# Patient Record
Sex: Female | Born: 2000 | Race: White | Hispanic: No | Marital: Single | Smoking: Never smoker
Health system: Southern US, Community
[De-identification: ages and names within clinical notes are randomized; demographics above are authoritative.]

## PROBLEM LIST (undated history)

## (undated) DIAGNOSIS — F909 Attention-deficit hyperactivity disorder, unspecified type: Secondary | ICD-10-CM

---
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
## 2021-01-10 ENCOUNTER — Telehealth: Payer: Self-pay

## 2021-01-10 ENCOUNTER — Ambulatory Visit: Payer: 59

## 2021-01-10 NOTE — Telephone Encounter (Signed)
Please schedule OV for pt to discuss with Dr. Durene Cal.

## 2021-01-10 NOTE — Telephone Encounter (Signed)
Patient called in and wanted to see if Dr. Durene Cal could send something in for her to gain weight patient stated she is getting very insecure about her weight.

## 2021-01-10 NOTE — Telephone Encounter (Signed)
Tried calling the patient to schedule appt but the phone just kept ringing. Patient is coming in tomorrow for a nurse visit and will schedule her then.

## 2021-01-11 ENCOUNTER — Ambulatory Visit (INDEPENDENT_AMBULATORY_CARE_PROVIDER_SITE_OTHER): Payer: 59 | Admitting: *Deleted

## 2021-01-11 ENCOUNTER — Encounter: Payer: Self-pay | Admitting: *Deleted

## 2021-01-11 ENCOUNTER — Other Ambulatory Visit: Payer: Self-pay

## 2021-01-11 DIAGNOSIS — N920 Excessive and frequent menstruation with regular cycle: Secondary | ICD-10-CM

## 2021-01-11 MED ORDER — MEDROXYPROGESTERONE ACETATE 150 MG/ML IM SUSP
150.0000 mg | Freq: Once | INTRAMUSCULAR | Status: AC
Start: 2021-01-11 — End: 2021-01-11
  Administered 2021-01-11: 150 mg via INTRAMUSCULAR

## 2021-01-11 NOTE — Telephone Encounter (Signed)
Reached out to pt and phone rang and then a busy signal came on, unable to reach pt.

## 2021-01-11 NOTE — Telephone Encounter (Signed)
Patient came in to the office and was seen for her NV, when checking out she said she needed to speak with Dr.Hunter about medication. Advised he was with a patient and would need to schedule an appointment to discuss this, offered an appointment next week. Patient declined.

## 2021-01-11 NOTE — Progress Notes (Signed)
Per orders of Dr. Durene Cal, injection of MedroxyProgesterone 150 mg/ ml IM injection given Right upper outer quadrant by Corky Mull LPN.  Patient tolerated injection well. Patient will make appointment for next injection July 7th thru July 21st. Pt verbalized understanding.

## 2021-01-18 ENCOUNTER — Ambulatory Visit: Payer: 59 | Admitting: Family Medicine

## 2021-01-18 DIAGNOSIS — Z0289 Encounter for other administrative examinations: Secondary | ICD-10-CM

## 2021-01-22 ENCOUNTER — Ambulatory Visit: Payer: 59 | Admitting: Physician Assistant

## 2021-01-22 NOTE — Progress Notes (Deleted)
Isabel Cunningham is a 20 y.o. female here for a new problem.  I acted as a Neurosurgeon for [employer], PA-C [employer], LPN   History of Present Illness:   No chief complaint on file.   HPI  Past Medical History:  Diagnosis Date  . Attention deficit hyperactivity disorder (ADHD)    prior on meds- no longer using. prescribed by pediatrician.      Social History   Tobacco Use  . Smoking status: Never Smoker  . Smokeless tobacco: Never Used  Substance Use Topics  . Alcohol use: Never  . Drug use: Never    Past Surgical History:  Procedure Laterality Date  . DENTAL SURGERY  07/22/2018   braces was added 03/23/2018  . TONSILLECTOMY  2009    Family History  Problem Relation Age of Onset  . Diabetes Father        told "borderline" per he  . Asthma Maternal Grandmother   . COPD Maternal Grandmother   . Rheum arthritis Maternal Grandmother        trial medication affected immune system and eventually killed her  . Heart disease Maternal Grandfather   . Other Paternal Grandmother        biological- healthy as far as they know  . Other Paternal Grandfather        biological- healthy as far as they know  . Asthma Brother   . ADD / ADHD Brother     Allergies  Allergen Reactions  . Bee Venom Swelling and Rash    Current Medications:   Current Outpatient Medications:  .  EPINEPHrine 0.3 mg/0.3 mL IJ SOAJ injection, Inject 0.3 mLs (0.3 mg total) into the muscle as needed for anaphylaxis., Disp: 1 each, Rfl: 1 .  medroxyPROGESTERone (DEPO-PROVERA) 150 MG/ML injection, Inject 150 mg into the muscle every 3 (three) months., Disp: , Rfl:    Review of Systems:   ROS  Vitals:   There were no vitals filed for this visit.   There is no height or weight on file to calculate BMI.  Physical Exam:   Physical Exam  Results for orders placed or performed in visit on 05/26/20  Novel Coronavirus, NAA ([facility])   Specimen: Nasopharyngeal(NP) swabs in vial transport medium    Nasopharynge  Screenin  Result Value Ref Range   SARS-CoV-2, NAA Not Detected Not Detected    Assessment and Plan:   There are no diagnoses linked to this encounter.    ***  Jarold Motto, PA-C

## 2021-04-24 ENCOUNTER — Telehealth: Payer: Self-pay

## 2021-04-24 ENCOUNTER — Ambulatory Visit (INDEPENDENT_AMBULATORY_CARE_PROVIDER_SITE_OTHER): Payer: 59

## 2021-04-24 ENCOUNTER — Other Ambulatory Visit: Payer: Self-pay

## 2021-04-24 DIAGNOSIS — Z309 Encounter for contraceptive management, unspecified: Secondary | ICD-10-CM | POA: Diagnosis not present

## 2021-04-24 DIAGNOSIS — N930 Postcoital and contact bleeding: Secondary | ICD-10-CM

## 2021-04-24 MED ORDER — MEDROXYPROGESTERONE ACETATE 150 MG/ML IM SUSP
150.0000 mg | Freq: Once | INTRAMUSCULAR | Status: AC
  Administered 2021-04-24: 150 mg via INTRAMUSCULAR

## 2021-04-24 NOTE — Telephone Encounter (Signed)
Patient seen today in office for nurse visit. During appt, she states that she is experiencing bleeding during intercourse. States this is a new problem that has only ever happened with her current partner. Described as a "lite period" that lasts for 2-3 days. Advised she needed to make an appt with Dr. Durene Cal, but she declined. Advised that I would send message to PCP.

## 2021-04-24 NOTE — Progress Notes (Signed)
Isabel Cunningham 20 yr old female presents to office for 3 month birth control encounter per Tana Conch, MD. Administered medroxyprogesterone 150mg /mL left side. Patient tolerated well.

## 2021-04-24 NOTE — Telephone Encounter (Signed)
Please refer her to GYN under postcoital bleeding tell her I really want her to follow-up with them

## 2021-04-24 NOTE — Telephone Encounter (Signed)
Error

## 2021-04-25 NOTE — Telephone Encounter (Signed)
I called the number on file for pt and that is not a working number, referral to GYN has been placed.

## 2021-04-25 NOTE — Addendum Note (Signed)
Addended by: Lieutenant Diego A on: 04/25/2021 09:32 AM   Modules accepted: Orders

## 2021-06-08 ENCOUNTER — Telehealth: Payer: 59 | Admitting: Family Medicine

## 2021-06-08 DIAGNOSIS — F900 Attention-deficit hyperactivity disorder, predominantly inattentive type: Secondary | ICD-10-CM

## 2021-07-26 ENCOUNTER — Ambulatory Visit: Payer: 59

## 2021-08-02 ENCOUNTER — Other Ambulatory Visit: Payer: Self-pay

## 2021-08-02 ENCOUNTER — Ambulatory Visit (INDEPENDENT_AMBULATORY_CARE_PROVIDER_SITE_OTHER): Payer: 59

## 2021-08-02 DIAGNOSIS — Z3042 Encounter for surveillance of injectable contraceptive: Secondary | ICD-10-CM | POA: Diagnosis not present

## 2021-08-02 MED ORDER — MEDROXYPROGESTERONE ACETATE 150 MG/ML IM SUSP
150.0000 mg | Freq: Once | INTRAMUSCULAR | Status: AC
  Administered 2021-08-02: 150 mg via INTRAMUSCULAR

## 2021-08-02 NOTE — Progress Notes (Signed)
After obtaining consent, and per orders of Dr. Durene Cal, injection of Depo Provera  given by Erick Alley.

## 2021-11-01 ENCOUNTER — Other Ambulatory Visit: Payer: Self-pay

## 2021-11-01 ENCOUNTER — Ambulatory Visit (INDEPENDENT_AMBULATORY_CARE_PROVIDER_SITE_OTHER): Payer: 59

## 2021-11-01 DIAGNOSIS — Z3042 Encounter for surveillance of injectable contraceptive: Secondary | ICD-10-CM | POA: Diagnosis not present

## 2021-11-01 MED ORDER — MEDROXYPROGESTERONE ACETATE 150 MG/ML IM SUSP
150.0000 mg | Freq: Once | INTRAMUSCULAR | Status: AC
  Administered 2021-11-01: 150 mg via INTRAMUSCULAR

## 2021-11-01 NOTE — Progress Notes (Signed)
Per orders of Dr. Durene Cal, injection of Depo-Provera 150 mg given IM by Corky Mull, LPN in right upper outer Patient tolerated injection well. Patient will make appointment for next injection April 27th thru May 11.

## 2022-01-29 ENCOUNTER — Encounter: Payer: Self-pay | Admitting: *Deleted

## 2022-01-29 ENCOUNTER — Ambulatory Visit (INDEPENDENT_AMBULATORY_CARE_PROVIDER_SITE_OTHER): Payer: 59 | Admitting: *Deleted

## 2022-01-29 DIAGNOSIS — Z309 Encounter for contraceptive management, unspecified: Secondary | ICD-10-CM | POA: Diagnosis not present

## 2022-01-29 MED ORDER — MEDROXYPROGESTERONE ACETATE 150 MG/ML IM SUSP
150.0000 mg | Freq: Once | INTRAMUSCULAR | Status: AC
  Administered 2022-01-29: 150 mg via INTRAMUSCULAR

## 2022-01-29 NOTE — Progress Notes (Signed)
I have reviewed and agree with note, evaluation, plan.   Sela Falk, MD  

## 2022-01-29 NOTE — Progress Notes (Signed)
Per orders of Dr. Durene Cal, injection of Depo-Provera 150 mg/ml given IM by Corky Mull, LPN in Right Upper Outer Quadrant. ?Patient tolerated injection well. Told pt to make appointment for next injection July 25 - August 8 and given piece of paper with it written on. Pt verbalized understanding. ? ?

## 2022-04-17 ENCOUNTER — Ambulatory Visit (INDEPENDENT_AMBULATORY_CARE_PROVIDER_SITE_OTHER): Payer: 59

## 2022-04-17 DIAGNOSIS — Z309 Encounter for contraceptive management, unspecified: Secondary | ICD-10-CM | POA: Diagnosis not present

## 2022-04-17 MED ORDER — MEDROXYPROGESTERONE ACETATE 150 MG/ML IM SUSP
150.0000 mg | Freq: Once | INTRAMUSCULAR | Status: AC
  Administered 2022-04-17: 150 mg via INTRAMUSCULAR

## 2022-04-18 ENCOUNTER — Ambulatory Visit: Payer: 59

## 2022-04-26 MED ORDER — MEDROXYPROGESTERONE ACETATE 150 MG/ML IM SUSP
150.0000 mg | Freq: Once | INTRAMUSCULAR | Status: AC
  Administered 2022-04-26: 150 mg via INTRAMUSCULAR

## 2022-04-26 NOTE — Addendum Note (Signed)
Addended by: Lieutenant Diego A on: 04/26/2022 09:20 AM   Modules accepted: Orders

## 2022-04-26 NOTE — Progress Notes (Signed)
Pt tolerated depo well 

## 2022-06-07 ENCOUNTER — Encounter: Payer: Self-pay | Admitting: Family

## 2022-06-07 ENCOUNTER — Ambulatory Visit: Payer: 59 | Admitting: Family

## 2022-06-07 VITALS — BP 125/85 | HR 101 | Temp 98.7°F | Ht <= 58 in | Wt 122.8 lb

## 2022-06-07 DIAGNOSIS — J069 Acute upper respiratory infection, unspecified: Secondary | ICD-10-CM | POA: Diagnosis not present

## 2022-06-07 NOTE — Progress Notes (Signed)
Patient ID: Isabel Cunningham, female    DOB: 12-23-2000, 21 y.o.   MRN: [id number]  Chief Complaint  Patient presents with   Sinus Problem    Pt c/o headache, nasal congestion and wet cough. Present since yesterday. Has tried mucinex and tylenol head and cold which did not help. Covid negative yesterday.     HPI:      Upper Respiratory Infection: Symptoms include achiness, headache described as frontal, nasal congestion, non productive cough, post nasal drip, and nasal drainage .  Onset of symptoms was 1 day ago, gradually worsening since that time. She is drinking moderate amounts of fluids. Evaluation to date: none.  Treatment to date:  Tylenol head & cold, Mucinex .     Assessment & Plan:  1. Viral upper respiratory tract infection sx started yesterday, covid negative, no fever, mostly sinus, started coughing this am. Advised on taking 600mg  Advil tid for aches, sinus pressure, headache, and generic Sudafed or Claritin D for sinus sx per package directions for 5-6 days. Drink at least 2 L of fluids daily.   Subjective:    Outpatient Medications Prior to Visit  Medication Sig Dispense Refill   EPINEPHrine 0.3 mg/0.3 mL IJ SOAJ injection Inject 0.3 mLs (0.3 mg total) into the muscle as needed for anaphylaxis. 1 each 1   medroxyPROGESTERone (DEPO-PROVERA) 150 MG/ML injection Inject 150 mg into the muscle every 3 (three) months.     No facility-administered medications prior to visit.   Past Medical History:  Diagnosis Date   Attention deficit hyperactivity disorder (ADHD)    prior on meds- no longer using. prescribed by pediatrician.    Past Surgical History:  Procedure Laterality Date   DENTAL SURGERY  07/22/2018   braces was added 03/23/2018   TONSILLECTOMY  2009   Allergies  Allergen Reactions   Bee Venom Swelling and Rash      Objective:    Physical Exam Vitals and nursing note reviewed.  Constitutional:      Appearance: Normal appearance. She is ill-appearing.   HENT:     Right Ear: There is impacted cerumen.     Left Ear: Tympanic membrane and ear canal normal.     Nose:     Right Sinus: No frontal sinus tenderness (pressure).     Left Sinus: No frontal sinus tenderness (pressure).     Mouth/Throat:     Mouth: Mucous membranes are moist.     Pharynx: Posterior oropharyngeal erythema present. No pharyngeal swelling, oropharyngeal exudate or uvula swelling.  Cardiovascular:     Rate and Rhythm: Normal rate and regular rhythm.  Pulmonary:     Effort: Pulmonary effort is normal.     Breath sounds: Normal breath sounds.  Musculoskeletal:        General: Normal range of motion.  Lymphadenopathy:     Cervical: No cervical adenopathy.  Skin:    General: Skin is warm and dry.  Neurological:     Mental Status: She is alert.  Psychiatric:        Mood and Affect: Mood normal.        Behavior: Behavior normal.    BP 125/85 (BP Location: Left Arm, Patient Position: Sitting, Cuff Size: Large)   Pulse (!) 101   Temp 98.7 F (37.1 C) (Temporal)   Ht 4' (1.219 m)   Wt 122 lb 12.8 oz (55.7 kg)   LMP  (LMP Unknown)   SpO2 98%   BMI 37.47 kg/m  Wt Readings from Last 3  Encounters:  06/07/22 122 lb 12.8 oz (55.7 kg)  03/22/20 99 lb (44.9 kg) (4 %, Z= -1.77)*  03/10/20 99 lb 9.6 oz (45.2 kg) (4 %, Z= -1.71)*   * Growth percentiles are based on CDC (Girls, 2-20 Years) data.       Dulce Sellar, NP

## 2022-06-17 ENCOUNTER — Encounter: Payer: Self-pay | Admitting: *Deleted

## 2022-07-18 ENCOUNTER — Ambulatory Visit (INDEPENDENT_AMBULATORY_CARE_PROVIDER_SITE_OTHER): Payer: 59 | Admitting: *Deleted

## 2022-07-18 DIAGNOSIS — Z3042 Encounter for surveillance of injectable contraceptive: Secondary | ICD-10-CM

## 2022-07-18 LAB — POCT URINE PREGNANCY: Preg Test, Ur: NEGATIVE

## 2022-07-18 MED ORDER — MEDROXYPROGESTERONE ACETATE 150 MG/ML IM SUSP
150.0000 mg | Freq: Once | INTRAMUSCULAR | Status: AC
  Administered 2022-07-18: 150 mg via INTRAMUSCULAR

## 2022-07-18 NOTE — Progress Notes (Signed)
Patient present for Depo Provera Inj  UPT done with negative results  Injection given on Rt hip IM Patient tolerated well  Advise to schedule nurse visit for F/U Depo inj between Jan/07/2023 -08/18/2023 Patient verbalized understanding

## 2022-09-05 ENCOUNTER — Encounter: Payer: Self-pay | Admitting: *Deleted

## 2022-10-03 ENCOUNTER — Ambulatory Visit (INDEPENDENT_AMBULATORY_CARE_PROVIDER_SITE_OTHER): Payer: 59

## 2022-10-03 DIAGNOSIS — Z309 Encounter for contraceptive management, unspecified: Secondary | ICD-10-CM | POA: Diagnosis not present

## 2022-10-03 MED ORDER — MEDROXYPROGESTERONE ACETATE 150 MG/ML IM SUSY: PREFILLED_SYRINGE | Freq: Once | INTRAMUSCULAR | Status: AC

## 2022-10-08 NOTE — Progress Notes (Signed)
Pt received Birth Control in right upper quadrant, pt tolerated well

## 2022-10-10 ENCOUNTER — Telehealth: Payer: Self-pay | Admitting: Family Medicine

## 2022-10-10 NOTE — Telephone Encounter (Signed)
Vml for patient to call back and sch CPE with Dr Retail banker

## 2023-01-02 ENCOUNTER — Ambulatory Visit (INDEPENDENT_AMBULATORY_CARE_PROVIDER_SITE_OTHER): Payer: 59

## 2023-01-02 DIAGNOSIS — Z309 Encounter for contraceptive management, unspecified: Secondary | ICD-10-CM

## 2023-01-02 MED ORDER — MEDROXYPROGESTERONE ACETATE 150 MG/ML IM SUSY
150.0000 mg | PREFILLED_SYRINGE | Freq: Once | INTRAMUSCULAR | Status: AC
  Administered 2023-01-02: 150 mg via INTRAMUSCULAR

## 2023-01-02 NOTE — Progress Notes (Signed)
Isabel Cunningham 22 yr old female presents to office today for 3 month depo shot per Tana Conch, MD. Administered MEDROXYPROGESTERONE 150 mg/mL right ventrogluteal. Patient tolerated well.

## 2023-04-03 ENCOUNTER — Ambulatory Visit (INDEPENDENT_AMBULATORY_CARE_PROVIDER_SITE_OTHER): Payer: 59

## 2023-04-03 DIAGNOSIS — Z3042 Encounter for surveillance of injectable contraceptive: Secondary | ICD-10-CM | POA: Diagnosis not present

## 2023-04-03 MED ORDER — MEDROXYPROGESTERONE ACETATE 150 MG/ML IM SUSP
150.0000 mg | Freq: Once | INTRAMUSCULAR | Status: AC
Start: 2023-04-03 — End: 2023-04-03
  Administered 2023-04-03: 150 mg via INTRAMUSCULAR

## 2023-04-03 NOTE — Progress Notes (Signed)
Patient was given the depo shot today. Patient tolerated injection well. Donzetta Starch, CMA

## 2023-07-03 ENCOUNTER — Ambulatory Visit (INDEPENDENT_AMBULATORY_CARE_PROVIDER_SITE_OTHER): Payer: 59

## 2023-07-03 DIAGNOSIS — Z309 Encounter for contraceptive management, unspecified: Secondary | ICD-10-CM | POA: Diagnosis not present

## 2023-07-03 MED ORDER — MEDROXYPROGESTERONE ACETATE 150 MG/ML IM SUSP
150.0000 mg | Freq: Once | INTRAMUSCULAR | Status: AC
Start: 2023-07-03 — End: 2023-07-03
  Administered 2023-07-03: 150 mg via INTRAMUSCULAR

## 2023-07-03 NOTE — Progress Notes (Signed)
Patient is in office today for a nurse visit for Birth Control Injection. Patient Injection was given in the  Right upper quad. gluteus. Patient tolerated injection well.

## 2023-10-07 ENCOUNTER — Ambulatory Visit: Payer: 59

## 2023-10-07 DIAGNOSIS — Z309 Encounter for contraceptive management, unspecified: Secondary | ICD-10-CM

## 2023-10-07 MED ORDER — MEDROXYPROGESTERONE ACETATE 150 MG/ML IM SUSP
150.0000 mg | Freq: Once | INTRAMUSCULAR | Status: AC
Start: 2023-10-07 — End: 2023-10-07
  Administered 2023-10-07: 150 mg via INTRAMUSCULAR

## 2023-10-07 NOTE — Progress Notes (Signed)
 Pt came in on the Nurse schedule to receive her depo injection. Administered in the Lt deltoid are w/o any complaints. Pt was advised to come back between 4/1-4/6. Pt understood.

## 2023-12-23 ENCOUNTER — Ambulatory Visit: Payer: 59

## 2023-12-30 ENCOUNTER — Ambulatory Visit (INDEPENDENT_AMBULATORY_CARE_PROVIDER_SITE_OTHER): Admitting: Family Medicine

## 2023-12-30 ENCOUNTER — Encounter: Payer: Self-pay | Admitting: Family Medicine

## 2023-12-30 VITALS — BP 100/64 | HR 81 | Temp 97.6°F | Ht 60.0 in | Wt 119.6 lb

## 2023-12-30 DIAGNOSIS — Z124 Encounter for screening for malignant neoplasm of cervix: Secondary | ICD-10-CM | POA: Diagnosis not present

## 2023-12-30 DIAGNOSIS — Z3009 Encounter for other general counseling and advice on contraception: Secondary | ICD-10-CM | POA: Diagnosis not present

## 2023-12-30 MED ORDER — VESTURA 3-0.02 MG PO TABS: 1.0000 | ORAL_TABLET | Freq: Every day | ORAL | 11 refills | Status: DC

## 2023-12-30 NOTE — Patient Instructions (Addendum)
 Due for Tetanus, Diphtheria, and Pertussis (Tdap)- please get this if get cut or scrape since declined this today  We have placed a referral for you today to gynecology- please call their # if you do not hear within a week (may be listed below or you may see mychart message within a few days with #).   Can start new birth control tomorrow  Recommended follow up: Return in about 6 months (around 06/30/2024) for physical or sooner if needed.Schedule b4 you leave.

## 2023-12-30 NOTE — Progress Notes (Signed)
  Phone [phone] In person visit   Subjective:   Isabel Cunningham is a 23 y.o. year old very pleasant female patient who presents for/with See problem oriented charting Chief Complaint  Patient presents with   Contraception    Pt would like to change from depo to an oral option does not have GYN   Past Medical History-  Patient Active Problem List   Diagnosis Date Noted   Attention deficit hyperactivity disorder (ADHD), predominantly inattentive type 03/12/2012    Priority: Medium    Heavy periods 02/09/2018    Priority: Low    Medications- reviewed and updated Current Outpatient Medications  Medication Sig Dispense Refill   EPINEPHrine 0.3 mg/0.3 mL IJ SOAJ injection Inject 0.3 mLs (0.3 mg total) into the muscle as needed for anaphylaxis. 1 each 1   No current facility-administered medications for this visit.     Objective:  BP 100/64   Pulse 81   Temp 97.6 F (36.4 C)   Ht 5' (1.524 m)   Wt 119 lb 9.6 oz (54.3 kg)   SpO2 98%   BMI 23.36 kg/m  Gen: NAD, resting comfortably CV: RRR no murmurs rubs or gallops Lungs: CTAB no crackles, wheeze, rhonchi Abdomen: soft/nontender/nondistended/normal bowel sounds. No rebound or guarding.  Ext: no edema Skin: warm, dry     Assessment and Plan   # Birth control discussion S:patient has been on depo provera with most recent injection 10/07/23. She would like to look at oral option. History of heavy menstruation -shed like to change with concern about bone density. Missed shot last week and having cycle currently - started last week and lightening and not reported as heavy as cycles she originally started on this for - she feels she's at a better point in her life to be consistent with a pill- previously Depo was more convenient for her A/P: about 12 weeks ago last depo provera- would like to transition to oral combined contraceptive (discussed mini pill as well) and this was ordered. As within 12 weeks and on active cycle- do  not need to retest pregnancy  -also wants to establish with GYN for pap and this was ordered  - agree with her about bone density loss potential as concern with depo provera and we had previously discussed this- glad she is at point where she feels she can be consistent with daily prescription  -still recommended using protection  Recommended follow up: Return in about 6 months (around 06/30/2024) for physical or sooner if needed.Schedule b4 you leave.   Lab/Order associations:   ICD-10-CM   1. Birth control counseling  Z30.09     2. Screening for cervical cancer  Z12.4 Ambulatory referral to Gynecology      Meds ordered this encounter  Medications   drospirenone-ethinyl estradiol (VESTURA) 3-0.02 MG tablet    Sig: Take 1 tablet by mouth daily.    Dispense:  28 tablet    Refill:  11   Time Spent: 27 minutes of total time (11:40- 12:07 PM) was spent on the date of the encounter performing the following actions: chart review prior to seeing the patient, obtaining history, counseling on the treatment  options, placing orders, and documenting in our EHR.   Return precautions advised.  Tana Conch, MD

## 2024-06-23 ENCOUNTER — Ambulatory Visit: Admitting: Physician Assistant

## 2024-07-06 ENCOUNTER — Encounter: Admitting: Family Medicine

## 2024-07-06 ENCOUNTER — Encounter: Payer: Self-pay | Admitting: Family Medicine

## 2024-10-14 ENCOUNTER — Ambulatory Visit: Payer: Self-pay | Admitting: *Deleted

## 2024-10-14 NOTE — Telephone Encounter (Signed)
 FYI Only or Action Required?: FYI only for provider: appointment scheduled on 1/23.  Patient was last seen in primary care on 12/30/2023 by Katrinka Garnette KIDD, MD.  Called Nurse Triage reporting positive pregnancy test.  Symptoms began several weeks ago.  Interventions attempted: Nothing.  Symptoms are: stable.  Triage Disposition: See PCP When Office is Open (Within 3 Days)  Patient/caregiver understands and will follow disposition?: Yes  Message from Deaijah H sent at 10/14/2024 11:19 AM EST  Reason for Triage: Pregnancy ; think she is / 4 positive test   Reason for Disposition  Wants a pregnancy test done in the office  Answer Assessment - Initial Assessment Questions 1. LMP:  When did your last menstrual period begin?     08/30/24- 4 days 2. DAYS LATE: How many days late is your period?     42 days 3. REGULARITY: How regular are your periods?     Irregular since stopping Depo 4. PREGNANCY: Is there any chance you are pregnant? (e.g., unprotected intercourse, missed birth control pill, broken condom) Have you used a home pregnancy test?     Yes- patient has done home + pregnancy 5. BREASTFEEDING: Are you breastfeeding?     no 6. BIRTH CONTROL PILLS: Are you taking birth control pills, or have you stopped recently?     no 7. LONG-ACTING CONTRACEPTION: Has your doctor given you a shot to prevent pregnancy? (e.g., Depo-Provera  injection) Do you have an intrauterine device (IUD)?     no 8. CAUSE: What do you think caused the missed period? (e.g., stress, rapid weight loss, excessive exercise)     pregnancy 9. OTHER SYMPTOMS: Do you have any other symptoms? (e.g., abdomen pain)     Not at this time  Protocols used: Menstrual Period - Missed or Late-A-AH

## 2024-10-14 NOTE — Telephone Encounter (Signed)
 Patient scheduled 10/15/2024 with Corean Comment. Dr. Katrinka will review triage notes.

## 2024-10-15 ENCOUNTER — Ambulatory Visit: Admitting: Family

## 2024-10-15 ENCOUNTER — Ambulatory Visit: Payer: Self-pay | Admitting: Family

## 2024-10-15 ENCOUNTER — Encounter: Payer: Self-pay | Admitting: Family

## 2024-10-15 VITALS — BP 100/62 | HR 79 | Temp 97.9°F | Ht 60.0 in | Wt 117.4 lb

## 2024-10-15 DIAGNOSIS — Z3201 Encounter for pregnancy test, result positive: Secondary | ICD-10-CM

## 2024-10-15 DIAGNOSIS — Z3491 Encounter for supervision of normal pregnancy, unspecified, first trimester: Secondary | ICD-10-CM

## 2024-10-15 DIAGNOSIS — N926 Irregular menstruation, unspecified: Secondary | ICD-10-CM

## 2024-10-15 LAB — POCT URINE PREGNANCY: Preg Test, Ur: POSITIVE — AB

## 2024-10-15 LAB — HCG, QUANTITATIVE, PREGNANCY: Quantitative HCG: 54506 m[IU]/mL

## 2024-10-15 NOTE — Patient Instructions (Signed)
 It was very nice to see you today!   I will review your lab results via MyChart in a few days.  I have sent the referral to our [facility] office. They should contact you to schedule.  See the attached info regarding pregnancy precautions.    PLEASE NOTE:  If you had any lab tests please let us  know if you have not heard back within a few days. You may see your results on MyChart before we have a chance to review them but we will give you a call once they are reviewed by us . If we ordered any referrals today, please let us  know if you have not heard from their office within the next week.

## 2024-10-15 NOTE — Progress Notes (Signed)
 "  Patient ID: Isabel Cunningham, female    DOB: 05/19/2001, 24 y.o.   MRN: [id number]  Chief Complaint  Patient presents with   Amenorrhea    Pt states she missed cycle in January. Positive pregnancy test at home on yesterday.  Discussed the use of AI scribe software for clinical note transcription with the patient, who gave verbal consent to proceed.  History of Present Illness Isabel Cunningham is a 24 year old female who presents with a confirmed positive pregnancy test.  The pregnancy was unplanned but acceptable to her. She stopped Depo-Provera  in March of last year and was trying to regulate menses before starting oral contraceptives. She has not used other birth control consistently. She developed a cough after an illness she calls a super flu about two weeks ago. Other symptoms have resolved, but the cough persists. She vapes daily and has done so for twelve years. She is worried about its effect on the pregnancy. She has had minimal pregnancy symptoms with a missed period and brief breast tenderness two weeks ago. She has had no significant nausea until today, which she attributes to anxiety, and no vomiting. She has avoided alcohol since she suspected she was pregnant.  Assessment & Plan First trimester pregnancy Confirmed pregnancy today with mild symptoms. First pregnancy. No alcohol since confirmation. - Sending referral to [facility] office per pt preference. - Ordered serum test to estimate gestational age. - Advised to avoid raw foods and cat feces. - Recommended Tylenol for fever, aches, and pains; avoid Advil , Aleve, and ibuprofen . - Provided information on pregnancy precautions.  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 Discussed risks of nicotine on fetal growth and importance of cessation. She is requesting serum quantitative HCG test to see how far along she is. Behavioral strategies prioritized due to pregnancy. - Recommend complete  cessation of vaping due to ill effects on her fetus. - Recommend trying the lowest dose of nicotine for her vaping pens if feeling jittery or anxious with complete cessation. - Discuss with OB options for cessation. - Provided information on behavioral strategies for smoking cessation. - Referred to counseling services for behavioral support if needed.   Subjective:    Outpatient Medications Prior to Visit  Medication Sig Dispense Refill   EPINEPHrine  0.3 mg/0.3 mL IJ SOAJ injection Inject 0.3 mLs (0.3 mg total) into the muscle as needed for anaphylaxis. 1 each 1   drospirenone-ethinyl estradiol (VESTURA ) 3-0.02 MG tablet Take 1 tablet by mouth daily. 28 tablet 11   No facility-administered medications prior to visit.   Past Medical History:  Diagnosis Date   Attention deficit hyperactivity disorder (ADHD)    prior on meds- no longer using. prescribed by pediatrician.    Past Surgical History:  Procedure Laterality Date   DENTAL SURGERY  07/22/2018   braces was added 03/23/2018   TONSILLECTOMY  2009   Allergies[1]    Objective:    Physical Exam Vitals and nursing note reviewed.  Constitutional:      Appearance: Normal appearance.  Cardiovascular:     Rate and Rhythm: Normal rate and regular rhythm.  Pulmonary:     Effort: Pulmonary effort is normal.     Breath sounds: Normal breath sounds.  Musculoskeletal:        General: Normal range of motion.  Skin:    General: Skin is warm and dry.  Neurological:     Mental Status: She is alert.  Psychiatric:  Mood and Affect: Mood normal.        Behavior: Behavior normal.    BP 100/62 (BP Location: Left Arm, Patient Position: Sitting, Cuff Size: Normal)   Pulse 79   Temp 97.9 F (36.6 C) (Temporal)   Ht 5' (1.524 m)   Wt 117 lb 6.4 oz (53.3 kg)   LMP 08/30/2024 (Exact Date)   SpO2 99%   Breastfeeding Unknown   BMI 22.93 kg/m  Wt Readings from Last 3 Encounters:  10/15/24 117 lb 6.4 oz (53.3 kg)  12/30/23 119  lb 9.6 oz (54.3 kg)  06/07/22 122 lb 12.8 oz (55.7 kg)       Isabel Ptacek, NP      [1]  Allergies Allergen Reactions   Bee Venom Swelling and Rash   "

## 2024-10-25 ENCOUNTER — Telehealth

## 2024-10-25 DIAGNOSIS — Z3400 Encounter for supervision of normal first pregnancy, unspecified trimester: Secondary | ICD-10-CM | POA: Insufficient documentation

## 2024-10-25 DIAGNOSIS — O3680X0 Pregnancy with inconclusive fetal viability, not applicable or unspecified: Secondary | ICD-10-CM

## 2024-10-25 NOTE — Patient Instructions (Signed)
 First Trimester of Pregnancy: What to Know  The first trimester of pregnancy starts on the first day of your last monthly period until the end of week 13. This is months 1 through 3 of pregnancy. A week after a sperm fertilizes an egg, the egg will implant into the wall of the uterus and begin to develop into a baby. Body changes during your first trimester Your body goes through many changes during pregnancy. The changes usually return to normal after your baby is born. Physical changes Your breasts may grow larger and may hurt. The area around your nipples may get darker. Your periods will stop. Your hair and nails may grow faster. You may pee more often. Health changes You may tire easily. Your gums may bleed and may be sensitive when you brush and floss. You may not feel hungry. You may have heartburn. You may throw up or feel like you may throw up. You may want to eat some foods, but not others. You may have headaches. You may have trouble pooping (constipation). Other changes Your emotions may change from day to day. You may have more dreams. Follow these instructions at home: Medicines Talk to your health care provider if you're taking medicines. Ask if the medicines are safe to take during pregnancy. Your provider may change the medicines that you take. Do not take any medicines unless told to by your provider. Take a prenatal vitamin that has at least 600 micrograms (mcg) of folic acid. Do not use herbal medicines, illegal substances, or medicines that are not approved by your provider. Eating and drinking While you're pregnant your body needs extra food for your growing baby. Talk with your provider about what to eat while pregnant. Activity Most women are able to exercise during pregnancy. Exercises may need to change as your pregnancy goes on. Talk to your provider about your activities and exercise routines. Relieving pain and discomfort Wear a good, supportive bra if  your breasts hurt. Rest with your legs raised if you have leg cramps or low back pain. Safety Wear your seatbelt at all times when you're in a car. Talk to your provider if someone hits you, hurts you, or yells at you. Talk with your provider if you're feeling sad or have thoughts of hurting yourself. Lifestyle Certain things can be harmful while you're pregnant. Follow these rules: Do not use hot tubs, steam rooms, or saunas. Do not douche. Do not use tampons or scented pads. Do not drink alcohol,smoke, vape, or use products with nicotine or tobacco in them. If you need help quitting, talk with your provider. Avoid cat litter boxes and soil used by cats. These things carry germs that can cause harm to your pregnancy and your baby. General instructions Keep all follow-up visits. It helps you and your unborn baby stay as healthy as possible. Write down your questions. Take them to your visits. Your provider will: Talk with you about your overall health. Give you advice or refer you to specialists who can help with different needs, including: Prenatal education classes. Mental health and counseling. Foods and healthy eating. Ask for help if you need help with food. Call your dentist and ask to be seen. Brush your teeth with a soft toothbrush. Floss gently. Where to find more information American Pregnancy Association: americanpregnancy.org [employer]: acog.org Office on [employer] Health: [place] Contact a health care provider if: You feel dizzy, faint, or have a fever. You vomit or have watery poop (  diarrhea) for 2 days or more. You have abnormal discharge or bleeding from your vagina. You have pain when you pee or your pee smells bad. You have cramps, pain, or pressure in your belly area. Get help right away if: You have trouble breathing or chest pain. You have any kind of injury, such as from a fall or a car crash. These symptoms may  be an emergency. Get help right away. Call 911. Do not wait to see if the symptoms will go away. Do not drive yourself to the hospital. This information is not intended to replace advice given to you by your health care provider. Make sure you discuss any questions you have with your health care provider. Document Revised: 07/19/2024 Document Reviewed: 01/10/2023 Elsevier Patient Education  2025 [employer].  Common Medications Safe in Pregnancy  Acne:      Constipation:  Benzoyl Peroxide     Colace  Clindamycin      Dulcolax Suppository  Topica Erythromycin     Fibercon  Salicylic Acid      Metamucil         Miralax  AVOID:        Senakot   Accutane    Cough:  Retin-A       Cough Drops  Tetracycline      Phenergan  w/ Codeine  if Rx  Minocycline      Robitussin (Plain & DM)  Antibiotics:     Crabs/Lice:  Ceclor       RID  Cephalosporins    AVOID:  E-Mycins      Kwell  Keflex  Macrobid /Macrodantin    Diarrhea:  Penicillin      Kao-Pectate  Zithromax       Imodium AD         PUSH FLUIDS AVOID:       Cipro     Fever:  Tetracycline      Tylenol  (Regular or Extra  Minocycline       Strength)  Levaquin      Extra Strength-Do not          Exceed 8 tabs/24 hrs Caffeine:        200mg /day (equiv. To 1 cup of coffee or  approx. 3 12 oz sodas)         Gas: Cold/Hayfever:       Gas-X  Benadryl       Mylicon  Claritin       Phazyme  **Claritin-D        Chlor-Trimeton    Headaches:  Dimetapp      ASA-Free Excedrin  Drixoral-Non-Drowsy     Cold Compress  Mucinex (Guaifenasin)     Tylenol  (Regular or Extra  Sudafed/Sudafed-12 Hour     Strength)  **Sudafed PE Pseudoephedrine   Tylenol  Cold & Sinus     Vicks Vapor Rub  Zyrtec  **AVOID if Problems With Blood Pressure         Heartburn: Avoid lying down for at least 1 hour after meals  Aciphex      Maalox     Rash:  Milk of Magnesia     Benadryl     Mylanta       1% Hydrocortisone Cream  Pepcid  Pepcid Complete   Sleep  Aids:  Prevacid      Ambien    Prilosec       Benadryl   Rolaids       Chamomile Tea  Tums (Limit 4/day)     Unisom  Tylenol  PM         Warm milk-add vanilla or  Hemorrhoids:       Sugar for taste  Anusol/Anusol H.C.  (RX: Analapram 2.5%)  Sugar Substitutes:  Hydrocortisone OTC     Ok in moderation  Preparation H      Tucks        Vaseline lotion applied to tissue with wiping    Herpes:     Throat:  Acyclovir      Oragel  Famvir  Valtrex     Vaccines:         Flu Shot Leg Cramps:       *Gardasil  Benadryl       Hepatitis A         Hepatitis B Nasal Spray:       Pneumovax  Saline Nasal Spray     Polio Booster         Tetanus Nausea:       Tuberculosis test or PPD  Vitamin B6 25 mg TID   AVOID:    Dramamine      *Gardasil  Emetrol       Live Poliovirus  Ginger Root 250 mg QID    MMR (measles, mumps &  High Complex Carbs @ Bedtime    rebella)  Sea Bands-Accupressure    Varicella (Chickenpox)  Unisom 1/2 tab TID     *No known complications           If received before Pain:         Known pregnancy;   Darvocet       Resume series after  Lortab        Delivery  Percocet    Yeast:   Tramadol      Femstat  Tylenol  3      Gyne-lotrimin  Ultram       Monistat  Vicodin           MISC:         All Sunscreens           Hair Coloring/highlights          Insect Repellant's          (Including DEET)         Mystic Tans   Commonly Asked Questions During Pregnancy  Cats: A parasite can be excreted in cat feces.  To avoid exposure you need to have another person empty the little box.  If you must empty the litter box you will need to wear gloves.  Wash your hands after handling your cat.  This parasite can also be found in raw or undercooked meat so this should also be avoided.  Colds, Sore Throats, Flu: Please check your medication sheet to see what you can take for symptoms.  If your symptoms are unrelieved by these medications please call the office.  Dental Work: Most  any dental work agricultural consultant recommends is permitted.  X-rays should only be taken during the first trimester if absolutely necessary.  Your abdomen should be shielded with a lead apron during all x-rays.  Please notify your provider prior to receiving any x-rays.  Novocaine is fine; gas is not recommended.  If your dentist requires a note from us  prior to dental work please call the office and we will provide one for you.  Exercise: Exercise is an important part of staying healthy during your pregnancy.  You may continue most exercises you were accustomed to prior to pregnancy.  Later  in your pregnancy you will most likely notice you have difficulty with activities requiring balance like riding a bicycle.  It is important that you listen to your body and avoid activities that put you at a higher risk of falling.  Adequate rest and staying well hydrated are a must!  If you have questions about the safety of specific activities ask your provider.    Exposure to Children with illness: Try to avoid obvious exposure; report any symptoms to us  when noted,  If you have chicken pos, red measles or mumps, you should be immune to these diseases.   Please do not take any vaccines while pregnant unless you have checked with your OB provider.  Fetal Movement: After 28 weeks we recommend you do kick counts twice daily.  Lie or sit down in a calm quiet environment and count your baby movements kicks.  You should feel your baby at least 10 times per hour.  If you have not felt 10 kicks within the first hour get up, walk around and have something sweet to eat or drink then repeat for an additional hour.  If count remains less than 10 per hour notify your provider.  Fumigating: Follow your pest control agent's advice as to how long to stay out of your home.  Ventilate the area well before re-entering.  Hemorrhoids:   Most over-the-counter preparations can be used during pregnancy.  Check your medication to see what is  safe to use.  It is important to use a stool softener or fiber in your diet and to drink lots of liquids.  If hemorrhoids seem to be getting worse please call the office.   Hot Tubs:  Hot tubs Jacuzzis and saunas are not recommended while pregnant.  These increase your internal body temperature and should be avoided.  Intercourse:  Sexual intercourse is safe during pregnancy as long as you are comfortable, unless otherwise advised by your provider.  Spotting may occur after intercourse; report any bright red bleeding that is heavier than spotting.  Labor:  If you know that you are in labor, please go to the hospital.  If you are unsure, please call the office and let us  help you decide what to do.  Lifting, straining, etc:  If your job requires heavy lifting or straining please check with your provider for any limitations.  Generally, you should not lift items heavier than that you can lift simply with your hands and arms (no back muscles)  Painting:  Paint fumes do not harm your pregnancy, but may make you ill and should be avoided if possible.  Latex or water based paints have less odor than oils.  Use adequate ventilation while painting.  Permanents & Hair Color:  Chemicals in hair dyes are not recommended as they cause increase hair dryness which can increase hair loss during pregnancy.   Highlighting and permanents are allowed.  Dye may be absorbed differently and permanents may not hold as well during pregnancy.  Sunbathing:  Use a sunscreen, as skin burns easily during pregnancy.  Drink plenty of fluids; avoid over heating.  Tanning Beds:  Because their possible side effects are still unknown, tanning beds are not recommended.  Ultrasound Scans:  Routine ultrasounds are performed at approximately 20 weeks.  You will be able to see your baby's general anatomy an if you would like to know the gender this can usually be determined as well.  If it is questionable when you conceived you may  also receive  an ultrasound early in your pregnancy for dating purposes.  Otherwise ultrasound exams are not routinely performed unless there is a medical necessity.  Although you can request a scan we ask that you pay for it when conducted because insurance does not cover  patient request scans.  Work: If your pregnancy proceeds without complications you may work until your due date, unless your physician or employer advises otherwise.  Round Ligament Pain/Pelvic Discomfort:  Sharp, shooting pains not associated with bleeding are fairly common, usually occurring in the second trimester of pregnancy.  They tend to be worse when standing up or when you remain standing for long periods of time.  These are the result of pressure of certain pelvic ligaments called round ligaments.  Rest, Tylenol  and heat seem to be the most effective relief.  As the womb and fetus grow, they rise out of the pelvis and the discomfort improves.  Please notify the office if your pain seems different than that described.  It may represent a more serious condition.

## 2024-11-09 ENCOUNTER — Other Ambulatory Visit

## 2024-11-23 ENCOUNTER — Encounter: Admitting: Certified Nurse Midwife
